# Patient Record
Sex: Male | Born: 1970 | Race: White | Hispanic: Yes | Marital: Married | Smoking: Current some day smoker
Health system: Southern US, Community
[De-identification: ages and names within clinical notes are randomized; demographics above are authoritative.]

---

## 2005-02-09 ENCOUNTER — Encounter: Admission: RE | Admit: 2005-02-09 | Discharge: 2005-02-09 | Payer: Self-pay | Admitting: Gastroenterology

## 2007-08-14 ENCOUNTER — Emergency Department (HOSPITAL_COMMUNITY): Admission: EM | Admit: 2007-08-14 | Discharge: 2007-08-14 | Payer: Self-pay | Admitting: Emergency Medicine

## 2008-08-06 IMAGING — CR DG ABDOMEN 2V
2 series · 2 of 2 positions shown · non-contrast
Comparison: none

CLINICAL DATA: Abdominal pain. 
 ABDOMEN - 2 VIEW:

[w abdomen upright]
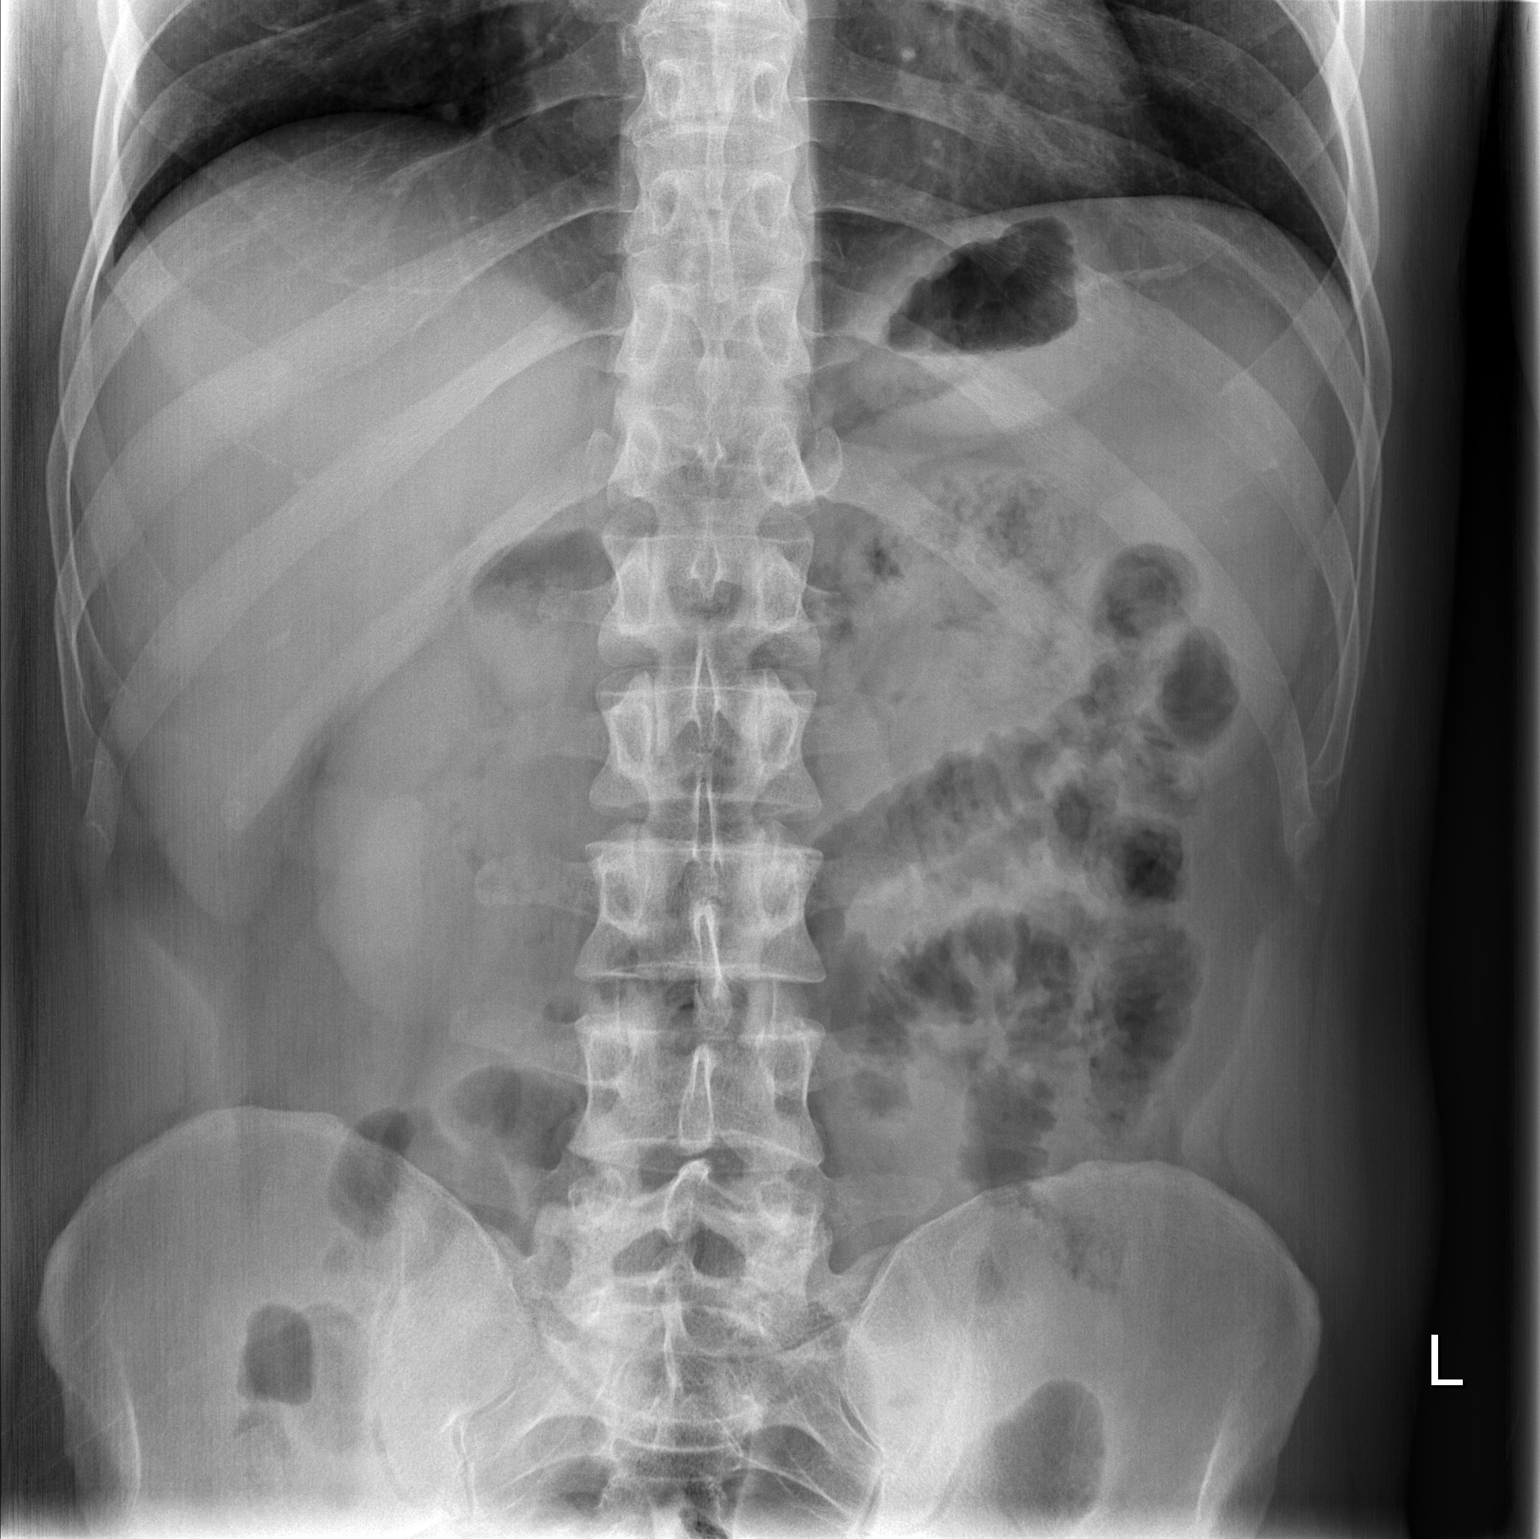

[t abdomen supine]
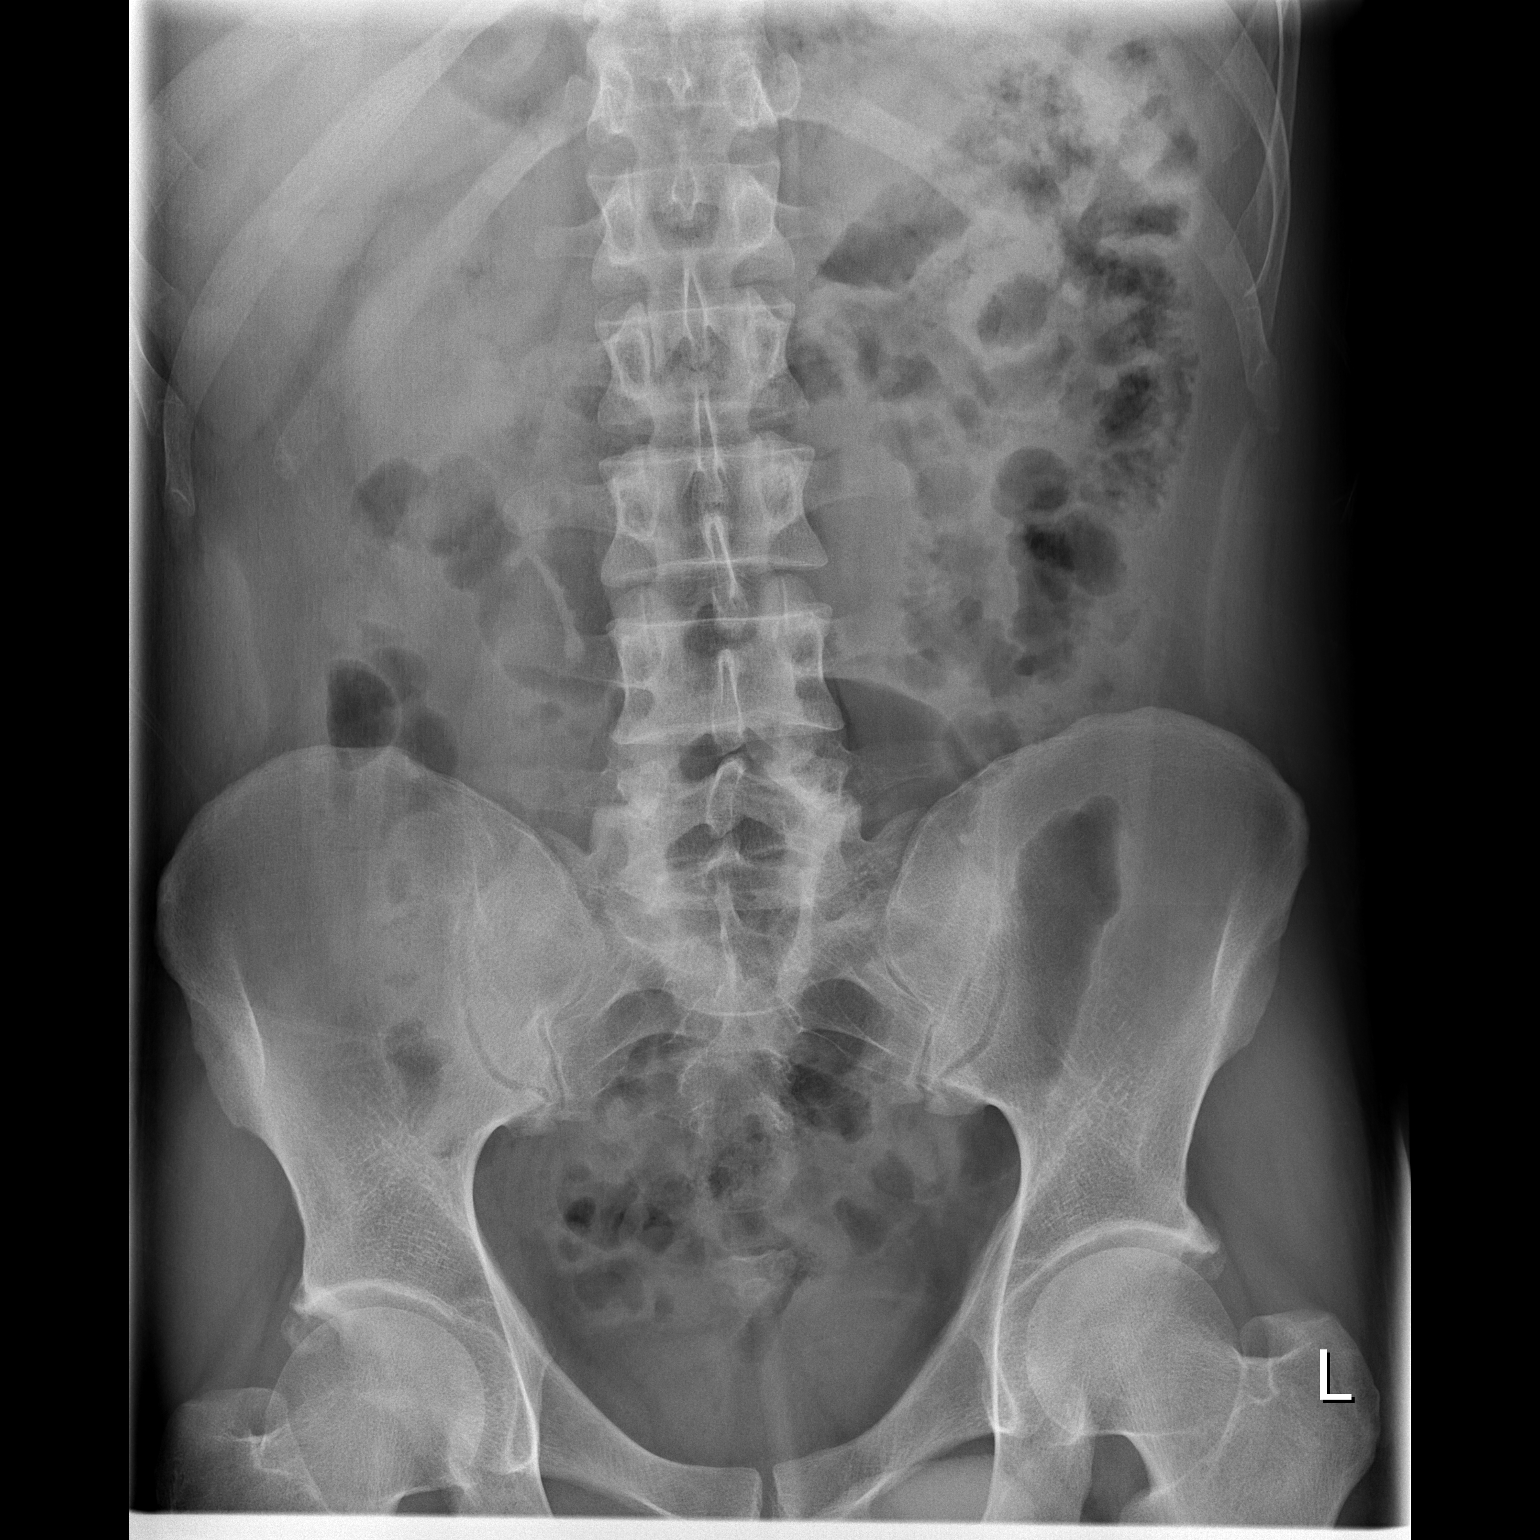

[2 of 2 positions shown; findings below may reference images not displayed]

FINDINGS: Supine and erect views of the abdomen 08/14/2007.
 Normal bowel gas pattern.  Negative for abdominal calcifications.  Osseous structures unremarkable.
IMPRESSION: Normal bowel gas pattern.

## 2009-03-03 ENCOUNTER — Emergency Department (HOSPITAL_COMMUNITY): Admission: EM | Admit: 2009-03-03 | Discharge: 2009-03-03 | Payer: Self-pay | Admitting: Emergency Medicine

## 2010-02-24 IMAGING — CR DG ABDOMEN ACUTE W/ 1V CHEST
3 series · 3 of 3 positions shown · non-contrast
Comparison: 08/14/2007

CLINICAL DATA: Bleeding ulcers

ACUTE ABDOMEN SERIES (ABDOMEN 2 VIEW & CHEST 1 VIEW)

[w chest pa]
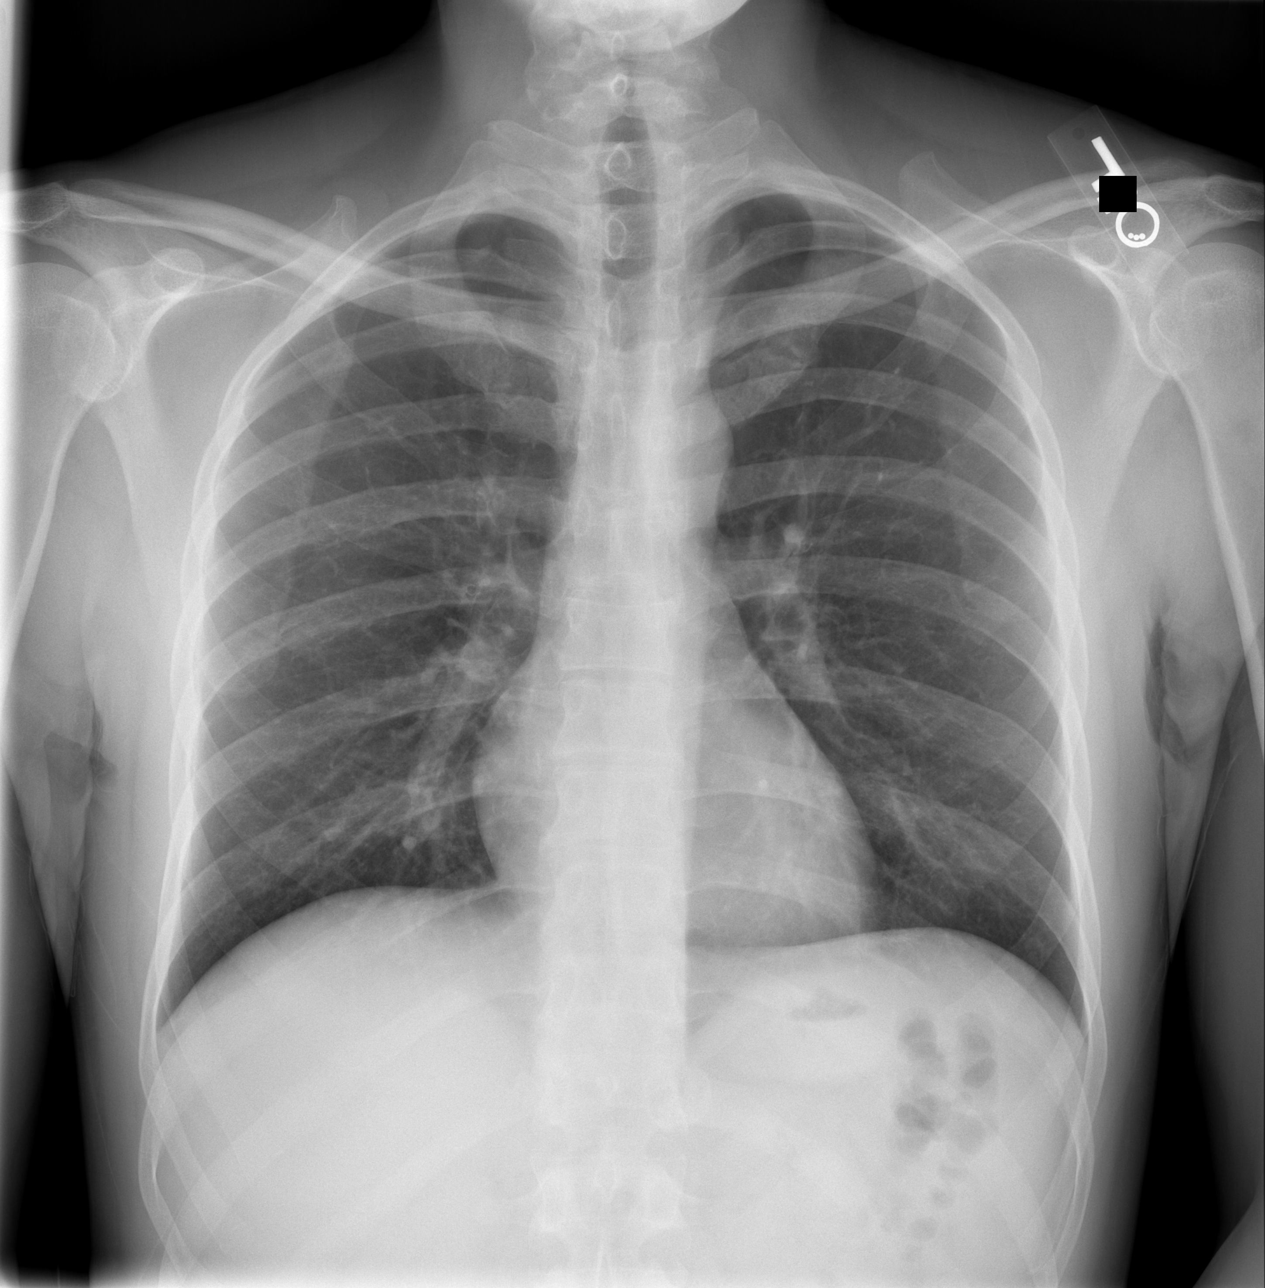

[w abdomen upright]
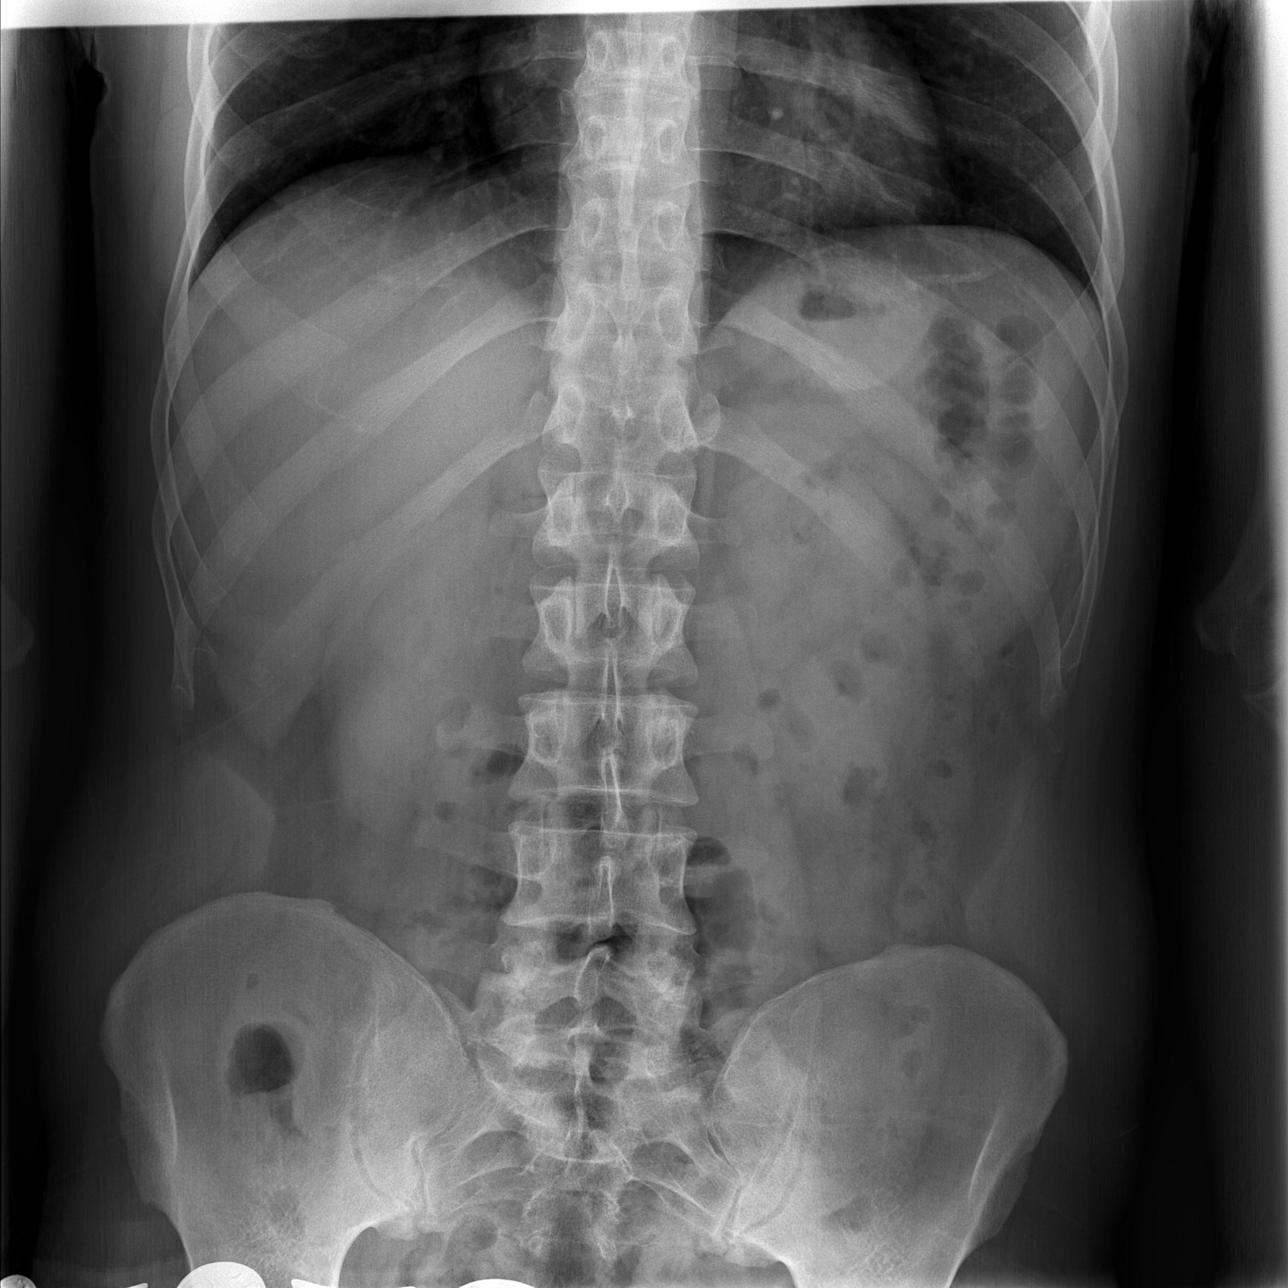

[t abdomen supine]
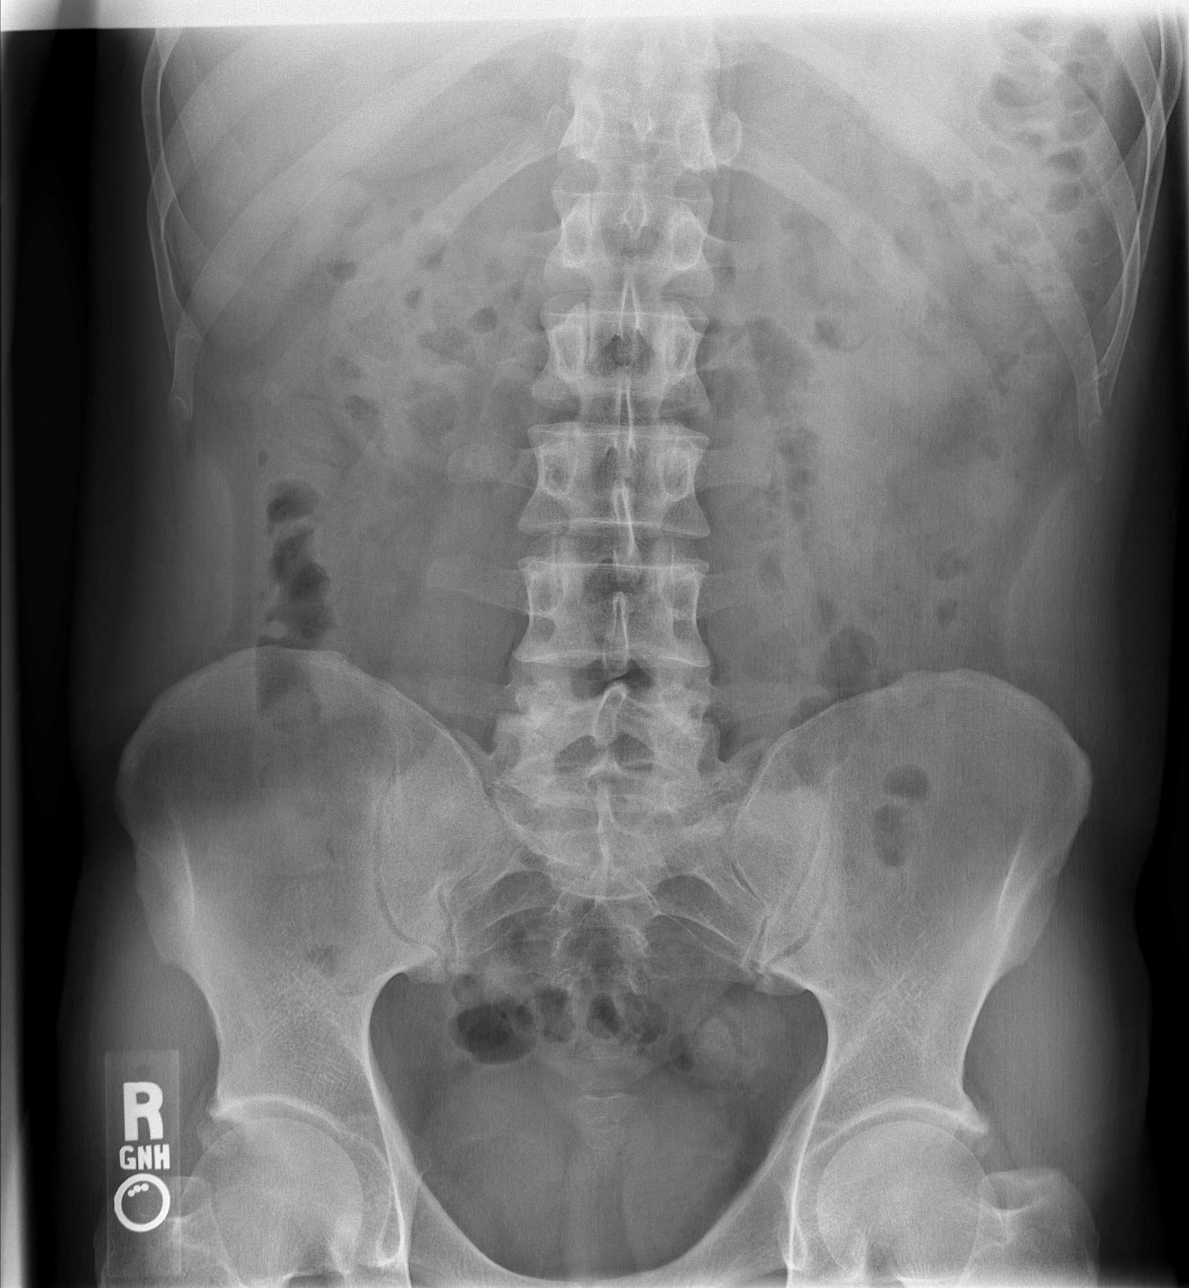

[3 of 3 positions shown; findings below may reference images not displayed]

FINDINGS: The heart is normal in size.  Minimal linear atelectasis
at the left base.  No pneumothorax or effusion.

No disproportionate dilatation of bowel or air-fluid levels.  No
free intraperitoneal gas.
IMPRESSION: Nonobstructive bowel gas pattern.

## 2011-02-25 LAB — HEPATIC FUNCTION PANEL
ALT: 30 U/L (ref 0–53)
AST: 33 U/L (ref 0–37)
Albumin: 4.6 g/dL (ref 3.5–5.2)
Alkaline Phosphatase: 105 U/L (ref 39–117)
Bilirubin, Direct: 0.2 mg/dL (ref 0.0–0.3)

## 2011-02-25 LAB — POCT I-STAT, CHEM 8
BUN: 15 mg/dL (ref 6–23)
Chloride: 105 mEq/L (ref 96–112)
Creatinine, Ser: 0.8 mg/dL (ref 0.4–1.5)
HCT: 52 % (ref 39.0–52.0)
TCO2: 26 mmol/L (ref 0–100)

## 2011-02-25 LAB — PROTIME-INR: INR: 1.1 (ref 0.00–1.49)

## 2011-02-25 LAB — HEMOCCULT GUIAC POC 1CARD (OFFICE): Fecal Occult Bld: NEGATIVE

## 2011-08-27 LAB — CBC
HCT: 47.2
Hemoglobin: 16.1
Platelets: 266
RBC: 5.6
RDW: 12.8

## 2011-08-27 LAB — URINALYSIS, ROUTINE W REFLEX MICROSCOPIC
Glucose, UA: NEGATIVE
Hgb urine dipstick: NEGATIVE
Protein, ur: 30 — AB
Specific Gravity, Urine: 1.027
Urobilinogen, UA: 1

## 2011-08-27 LAB — RAPID URINE DRUG SCREEN, HOSP PERFORMED
Amphetamines: NOT DETECTED
Opiates: NOT DETECTED

## 2011-08-27 LAB — COMPREHENSIVE METABOLIC PANEL
ALT: 21
AST: 25
Albumin: 4.8
Alkaline Phosphatase: 125 — ABNORMAL HIGH
Creatinine, Ser: 0.94
GFR calc Af Amer: >60
GFR calc non Af Amer: >60
Glucose, Bld: 103 — ABNORMAL HIGH
Total Bilirubin: 1.1

## 2011-08-27 LAB — URINE MICROSCOPIC-ADD ON

## 2011-08-27 LAB — DIFFERENTIAL
Basophils Absolute: 0
Basophils Relative: 0
Monocytes Absolute: 0.8 — ABNORMAL HIGH
Neutro Abs: 13.9 — ABNORMAL HIGH
Neutrophils Relative %: 86 — ABNORMAL HIGH

## 2017-01-20 ENCOUNTER — Other Ambulatory Visit: Payer: Self-pay | Admitting: Gastroenterology

## 2017-03-22 ENCOUNTER — Encounter (HOSPITAL_COMMUNITY): Payer: Self-pay | Admitting: Anesthesiology

## 2017-03-22 ENCOUNTER — Encounter (HOSPITAL_COMMUNITY): Payer: Self-pay

## 2017-03-22 ENCOUNTER — Ambulatory Visit (HOSPITAL_COMMUNITY)
Admission: RE | Admit: 2017-03-22 | Discharge: 2017-03-22 | Disposition: A | Discharge status: Home / Self Care | Payer: Self-pay | Source: Ambulatory Visit | Attending: Gastroenterology | Admitting: Gastroenterology

## 2017-03-22 ENCOUNTER — Encounter (HOSPITAL_COMMUNITY)
Admission: RE | Disposition: A | Discharge status: Home / Self Care | Payer: Self-pay | Source: Ambulatory Visit | Attending: Gastroenterology

## 2017-03-22 DIAGNOSIS — Z1211 Encounter for screening for malignant neoplasm of colon: Secondary | ICD-10-CM | POA: Insufficient documentation

## 2017-03-22 DIAGNOSIS — Z538 Procedure and treatment not carried out for other reasons: Secondary | ICD-10-CM | POA: Insufficient documentation

## 2017-03-22 SURGERY — CANCELLED PROCEDURE

## 2017-03-22 MED ORDER — PROPOFOL 10 MG/ML IV BOLUS
INTRAVENOUS | Status: AC
  Filled 2017-03-22: qty 40

## 2017-03-22 MED ORDER — LIDOCAINE 2% (20 MG/ML) 5 ML SYRINGE
INTRAMUSCULAR | Status: AC
  Filled 2017-03-22: qty 5

## 2017-03-22 SURGICAL SUPPLY — 21 items

## 2017-03-22 NOTE — Anesthesia Preprocedure Evaluation (Signed)
Anesthesia Evaluation  Patient identified by MRN, date of birth, ID band Patient awake    Reviewed: Allergy & Precautions, NPO status , Patient's Chart, lab work & pertinent test results  Airway Mallampati: II   Neck ROM: Full    Dental  (+) Teeth Intact   Pulmonary    breath sounds clear to auscultation       Cardiovascular  Rhythm:Regular Rate:Normal     Neuro/Psych    GI/Hepatic   Endo/Other    Renal/GU      Musculoskeletal   Abdominal   Peds  Hematology   Anesthesia Other Findings   Reproductive/Obstetrics                             Anesthesia Physical Anesthesia Plan  ASA: II  Anesthesia Plan: MAC   Post-op Pain Management:    Induction: Intravenous  Airway Management Planned: Natural Airway and Simple Face Mask  Additional Equipment:   Intra-op Plan:   Post-operative Plan:   Informed Consent: I have reviewed the patients History and Physical, chart, labs and discussed the procedure including the risks, benefits and alternatives for the proposed anesthesia with the patient or authorized representative who has indicated his/her understanding and acceptance.     Plan Discussed with:   Anesthesia Plan Comments:         Anesthesia Quick Evaluation

## 2017-03-22 NOTE — Progress Notes (Signed)
Pt did not do colon prep for colonoscopy. Procedure cancelled. brt, rn

## 2017-04-13 ENCOUNTER — Other Ambulatory Visit: Payer: Self-pay | Admitting: Gastroenterology

## 2017-05-03 ENCOUNTER — Ambulatory Visit (HOSPITAL_COMMUNITY)
Admission: RE | Admit: 2017-05-03 | Discharge: 2017-05-03 | Disposition: A | Discharge status: Home / Self Care | Payer: Self-pay | Source: Ambulatory Visit | Attending: Gastroenterology | Admitting: Gastroenterology

## 2017-05-03 ENCOUNTER — Encounter (HOSPITAL_COMMUNITY): Payer: Self-pay | Admitting: Registered Nurse

## 2017-05-03 ENCOUNTER — Encounter (HOSPITAL_COMMUNITY)
Admission: RE | Disposition: A | Discharge status: Home / Self Care | Payer: Self-pay | Source: Ambulatory Visit | Attending: Gastroenterology

## 2017-05-03 DIAGNOSIS — Z538 Procedure and treatment not carried out for other reasons: Secondary | ICD-10-CM | POA: Insufficient documentation

## 2017-05-03 DIAGNOSIS — F17200 Nicotine dependence, unspecified, uncomplicated: Secondary | ICD-10-CM | POA: Insufficient documentation

## 2017-05-03 SURGERY — CANCELLED PROCEDURE

## 2017-05-03 MED ORDER — SODIUM CHLORIDE 0.9 % IV SOLN: INTRAVENOUS | Status: DC

## 2017-05-03 MED ORDER — PROPOFOL 10 MG/ML IV BOLUS
INTRAVENOUS | Status: AC
  Filled 2017-05-03: qty 20

## 2017-05-03 SURGICAL SUPPLY — 21 items

## 2017-05-03 NOTE — Anesthesia Preprocedure Evaluation (Deleted)
Anesthesia Evaluation  Patient identified by MRN, date of birth, ID band Patient awake    Airway        Dental   Pulmonary neg pulmonary ROS, Current Smoker,           Cardiovascular negative cardio ROS       Neuro/Psych negative neurological ROS  negative psych ROS   GI/Hepatic negative GI ROS, Neg liver ROS,   Endo/Other  negative endocrine ROS  Renal/GU negative Renal ROS  negative genitourinary   Musculoskeletal negative musculoskeletal ROS (+)   Abdominal   Peds negative pediatric ROS (+)  Hematology negative hematology ROS (+)   Anesthesia Other Findings   Reproductive/Obstetrics negative OB ROS                             Anesthesia Physical Anesthesia Plan  ASA: II  Anesthesia Plan: MAC   Post-op Pain Management:    Induction:   PONV Risk Score and Plan: 0 and Propofol  Airway Management Planned:   Additional Equipment:   Intra-op Plan:   Post-operative Plan:   Informed Consent: I have reviewed the patients History and Physical, chart, labs and discussed the procedure including the risks, benefits and alternatives for the proposed anesthesia with the patient or authorized representative who has indicated his/her understanding and acceptance.     Plan Discussed with: CRNA  Anesthesia Plan Comments:         Anesthesia Quick Evaluation

## 2017-05-03 NOTE — Progress Notes (Signed)
Patient interview conducted using [employer] [phone]#[id number]. Patient arrived for outpatient colonoscopy today with Dr. Laural BenesJohnson. Patient reports eating soup with chicken last evening at 1700. Discussed with Dr. Laural BenesJohnson, verbal order received to cancel procedure today due to inadequate prep. Dr. Laural BenesJohnson spoke with patient regarding need to cancel and informed patient his office will call him to reschedule.
